# Patient Record
Sex: Male | Born: 1945 | Race: White | Hispanic: No | Marital: Married | Smoking: Never smoker
Health system: Southern US, Community
[De-identification: ages and names within clinical notes are randomized; demographics above are authoritative.]

## PROBLEM LIST (undated history)

## (undated) DIAGNOSIS — I4891 Unspecified atrial fibrillation: Secondary | ICD-10-CM

## (undated) DIAGNOSIS — E785 Hyperlipidemia, unspecified: Secondary | ICD-10-CM

## (undated) HISTORY — DX: Unspecified atrial fibrillation: I48.91

## (undated) HISTORY — DX: Hyperlipidemia, unspecified: E78.5

---

## 2000-12-22 DIAGNOSIS — I4891 Unspecified atrial fibrillation: Secondary | ICD-10-CM

## 2000-12-22 HISTORY — DX: Unspecified atrial fibrillation: I48.91

## 2015-05-23 HISTORY — PX: PROCTECTOMY: SHX315

## 2016-07-09 HISTORY — PX: CORONARY ARTERY BYPASS GRAFT: SHX141

## 2017-04-19 ENCOUNTER — Emergency Department (HOSPITAL_COMMUNITY): Payer: Medicare Other

## 2017-04-19 ENCOUNTER — Emergency Department (HOSPITAL_COMMUNITY)
Admission: EM | Admit: 2017-04-19 | Discharge: 2017-04-19 | Disposition: A | Discharge status: Home / Self Care | Payer: Medicare Other | Attending: Emergency Medicine | Admitting: Emergency Medicine

## 2017-04-19 ENCOUNTER — Encounter (HOSPITAL_COMMUNITY): Payer: Self-pay

## 2017-04-19 DIAGNOSIS — I4891 Unspecified atrial fibrillation: Secondary | ICD-10-CM | POA: Insufficient documentation

## 2017-04-19 DIAGNOSIS — Z951 Presence of aortocoronary bypass graft: Secondary | ICD-10-CM | POA: Insufficient documentation

## 2017-04-19 DIAGNOSIS — R911 Solitary pulmonary nodule: Secondary | ICD-10-CM | POA: Insufficient documentation

## 2017-04-19 HISTORY — DX: Unspecified atrial fibrillation: I48.91

## 2017-04-19 LAB — CBC WITH DIFFERENTIAL/PLATELET
BASOS PCT: 0 %
Basophils Absolute: 0 10*3/uL (ref 0.0–0.1)
EOS ABS: 0.3 10*3/uL (ref 0.0–0.7)
Eosinophils Relative: 5 %
HEMATOCRIT: 39 % (ref 39.0–52.0)
Hemoglobin: 13.1 g/dL (ref 13.0–17.0)
Lymphocytes Relative: 17 %
Lymphs Abs: 1.2 10*3/uL (ref 0.7–4.0)
MCH: 30.6 pg (ref 26.0–34.0)
MCHC: 33.6 g/dL (ref 30.0–36.0)
MCV: 91.1 fL (ref 78.0–100.0)
MONO ABS: 0.9 10*3/uL (ref 0.1–1.0)
MONOS PCT: 12 %
NEUTROS ABS: 4.6 10*3/uL (ref 1.7–7.7)
Neutrophils Relative %: 66 %
Platelets: 192 10*3/uL (ref 150–400)
RBC: 4.28 MIL/uL (ref 4.22–5.81)
RDW: 13.1 % (ref 11.5–15.5)
WBC: 7.1 10*3/uL (ref 4.0–10.5)

## 2017-04-19 LAB — URINALYSIS, ROUTINE W REFLEX MICROSCOPIC
Bilirubin Urine: NEGATIVE
Glucose, UA: NEGATIVE mg/dL
Hgb urine dipstick: NEGATIVE
Ketones, ur: NEGATIVE mg/dL
LEUKOCYTES UA: NEGATIVE
NITRITE: NEGATIVE
PH: 7 (ref 5.0–8.0)
Protein, ur: NEGATIVE mg/dL
SPECIFIC GRAVITY, URINE: 1.006 (ref 1.005–1.030)

## 2017-04-19 LAB — I-STAT TROPONIN, ED: Troponin i, poc: 0.01 ng/mL (ref 0.00–0.08)

## 2017-04-19 LAB — BASIC METABOLIC PANEL
Anion gap: 10 (ref 5–15)
BUN: 19 mg/dL (ref 6–20)
CALCIUM: 9.7 mg/dL (ref 8.9–10.3)
CO2: 25 mmol/L (ref 22–32)
CREATININE: 1.06 mg/dL (ref 0.61–1.24)
Chloride: 105 mmol/L (ref 101–111)
GFR calc Af Amer: >60 mL/min (ref >60)
GFR calc non Af Amer: >60 mL/min (ref >60)
GLUCOSE: 121 mg/dL — AB (ref 65–99)
Potassium: 3.7 mmol/L (ref 3.5–5.1)
Sodium: 140 mmol/L (ref 135–145)

## 2017-04-19 MED ORDER — DILTIAZEM HCL-DEXTROSE 100-5 MG/100ML-% IV SOLN (PREMIX)
5.0000 mg/h | Freq: Once | INTRAVENOUS | Status: AC
  Administered 2017-04-19: 5 mg/h via INTRAVENOUS
  Filled 2017-04-19: qty 100

## 2017-04-19 MED ORDER — APIXABAN 5 MG PO TABS
5.0000 mg | ORAL_TABLET | Freq: Two times a day (BID) | ORAL | Status: DC
  Administered 2017-04-19: 5 mg via ORAL
  Filled 2017-04-19: qty 1

## 2017-04-19 MED ORDER — APIXABAN 5 MG PO TABS: 5.0000 mg | ORAL_TABLET | Freq: Two times a day (BID) | ORAL | 0 refills | Status: AC

## 2017-04-19 MED ORDER — ETOMIDATE 2 MG/ML IV SOLN
5.0000 mg | Freq: Once | INTRAVENOUS | Status: AC
  Administered 2017-04-19: 5 mg via INTRAVENOUS
  Filled 2017-04-19: qty 10

## 2017-04-19 NOTE — Sedation Documentation (Signed)
Family updated as to patient's status and at bedside  

## 2017-04-19 NOTE — ED Provider Notes (Signed)
[facility]-EMERGENCY DEPT Provider Note   CSN: [id number] Arrival date & time: 04/19/17  0105   By signing my name below, I, Clovis Pu, attest that this documentation has been prepared under the direction and in the presence of Zadie Rhine, MD  Electronically Signed: Clovis Pu, ED Scribe. 04/19/17. 1:26 AM.   History   Chief Complaint Chief Complaint  Patient presents with  . Atrial Fibrillation    HPI Comments:  Stanley Robinson is a 71 y.o. male, with a PMHX of a-fib and PSHx of CABG in 2017, who presents to the Emergency Department complaining of acute onset, persistent palpitations beginning around 11 PM yesterday. Pt notes he was watching TV before going to sleep when his symptoms began and states he felt fine before this episode began. He notes a hx of atrial fibrillation in 2002. Pt also reports frequent urination. Pt notes he takes statin and baby aspirin on a daily basis. No alleviating or aggravating factors noted. Pt denies chest pain, fevers, vomiting, SOB, abdominal pain, leg swelling or any other associated symptoms.  Pt also denies blood thinner use, food allergies, a hx of COPD, lung problems or a hx of valve replacements. His last meal was at 7 PM yesterday. Pt is allergic to sulfa drugs. No other complaints noted at this time.   The history is provided by the patient. No language interpreter was used.  Atrial Fibrillation  This is a new problem. The current episode started 1 to 2 hours ago. The problem occurs constantly. The problem has not changed since onset.Pertinent negatives include no chest pain, no abdominal pain and no shortness of breath. Nothing aggravates the symptoms. Nothing relieves the symptoms. He has tried nothing for the symptoms.    Past Medical History:  Diagnosis Date  . Atrial fibrillation (HCC) 2002    There are no active problems to display for this patient.   Past Surgical History:  Procedure Laterality Date  . CORONARY ARTERY BYPASS  GRAFT  07/09/2016  . PROCTECTOMY  05/2015    Home Medications    Prior to Admission medications   Not on File    Family History History reviewed. No pertinent family history.  Social History Social History  Substance Use Topics  . Smoking status: Never Smoker  . Smokeless tobacco: Never Used  . Alcohol use 1.2 oz/week    2 Cans of beer per week     Allergies   Sulfa antibiotics   Review of Systems Review of Systems  Constitutional: Negative for fever.  Respiratory: Negative for shortness of breath.   Cardiovascular: Positive for palpitations. Negative for chest pain and leg swelling.  Gastrointestinal: Negative for abdominal pain and vomiting.  Genitourinary: Positive for frequency.  All other systems reviewed and are negative.   Physical Exam Updated Vital Signs BP (!) 114/93 (BP Location: Right Arm)   Pulse (!) 151   Temp 97.9 F (36.6 C) (Oral)   Resp 20   Ht  (1.676 m)   Wt 165 lb (74.8 kg)   SpO2 96%   BMI 26.63 kg/m   Physical Exam CONSTITUTIONAL: Well developed/well nourished HEAD: Normocephalic/atraumatic EYES: EOMI  ENMT: Mucous membranes moist NECK: supple no meningeal signs SPINE/BACK:entire spine nontender CV: tachycardic and irregular  LUNGS: Lungs are clear to auscultation bilaterally, no apparent distress ABDOMEN: soft, nontender, no rebound or guarding, bowel sounds noted throughout abdomen GU:no cva tenderness NEURO: Pt is awake/alert/appropriate, moves all extremitiesx4.  No facial droop.   EXTREMITIES: pulses normal/equal, full ROM  SKIN: warm, color normal. Well healed sternotomy scar noted.  PSYCH: no abnormalities of mood noted, alert and oriented to situation  ED Treatments / Results  DIAGNOSTIC STUDIES:  Oxygen Saturation is 96% on RA, normal by my interpretation.    COORDINATION OF CARE:  1:20 AM Discussed treatment plan with pt at bedside and pt agreed to plan.  Labs (all labs ordered are listed, but only  abnormal results are displayed) Labs Reviewed  BASIC METABOLIC PANEL - Abnormal; Notable for the following:       Result Value   Glucose, Bld 121 (*)    All other components within normal limits  URINALYSIS, ROUTINE W REFLEX MICROSCOPIC - Abnormal; Notable for the following:    Color, Urine STRAW (*)    All other components within normal limits  CBC WITH DIFFERENTIAL/PLATELET  Rosezena Sensor, ED    EKG  EKG Interpretation  Date/Time:  Sunday April 19 2017 01:06:54 EDT Ventricular Rate:  152 PR Interval:    QRS Duration: 79 QT Interval:  250 QTC Calculation: 398 R Axis:   94 Text Interpretation:  Atrial fibrillation with rapid V-rate Right axis deviation Low voltage, precordial leads Repolarization abnormality, prob rate related Abnormal ekg No previous ECGs available Confirmed by Bebe Shaggy  MD, Devontae Casasola ([id number]) on 04/19/2017 1:12:33 AM       Radiology Dg Chest 2 View  Result Date: 04/19/2017 CLINICAL DATA:  71 year old male with palpitation. EXAM: CHEST  2 VIEW COMPARISON:  None. FINDINGS: There is mild eventration of the right hemidiaphragm. Minimal bibasilar atelectatic changes, left greater right. There is no focal consolidation, pleural effusion, or pneumothorax. A faint 11 mm nodular appearing density in the left upper lobe superimposed over the clavicle is likely artifactual and summation of the osseous structures. The lung nodule is less likely. CT may provide better evaluation of the lungs. The cardiac silhouette is within normal limits. Median sternotomy wires and CABG vascular clips noted. Osteopenia with degenerative changes of the spine. No acute fracture. IMPRESSION: 1. No acute cardiopulmonary process. 2. Artifact versus less likely left upper lobe nodule. Electronically Signed   By: Elgie Collard M.D.   On: 04/19/2017 02:14    Procedures .Cardioversion Date/Time: 04/19/2017 5:28 AM Performed by: Zadie Rhine Authorized by: Zadie Rhine   Consent:     Consent obtained:  Written   Consent given by:  Patient   Risks discussed:  Induced arrhythmia and pain   Alternatives discussed:  Rate-control medication and anti-coagulation medication Pre-procedure details:    Cardioversion basis:  Emergent   Rhythm:  Atrial fibrillation   Electrode placement:  Anterior-posterior Attempt one:    Cardioversion mode:  Synchronous   Shock (Joules):  100   Shock outcome:  Conversion to normal sinus rhythm Attempt three:    Shock (Joules):  100 Post-procedure details:    Patient status:  Alert   Patient tolerance of procedure:  Tolerated well, no immediate complications    Procedural sedation Performed by: Joya Gaskins Consent: Verbal consent obtained. Risks and benefits: risks, benefits and alternatives were discussed Required items: required  devices, and special equipment available Patient identity confirmed: arm band and provided demographic data Time out: Immediately prior to procedure a "time out" was called to verify the correct patient, procedure, equipment, support staff and site/side marked as required.  Sedation type: moderate (conscious) sedation NPO time confirmed and considedered  Sedatives: ETOMIDATE  Physician Time at Bedside: 16  Vitals: Vital signs were monitored during sedation. Cardiac Monitor, pulse oximeter Patient tolerance:  Patient tolerated the procedure well with no immediate complications. Comments: Pt with uneventful recovered. Returned to pre-procedural sedation baseline   Medications Ordered in ED Medications  apixaban (ELIQUIS) tablet 5 mg (5 mg Oral Given 04/19/17 0600)  diltiazem (CARDIZEM) 100 mg in dextrose 5% (1 mg/mL) infusion (0 mg/hr Intravenous Stopped 04/19/17 0500)  etomidate (AMIDATE) injection 5 mg (5 mg Intravenous Given 04/19/17 0510)     Initial Impression / Assessment and Plan / ED Course  I have reviewed the triage vital signs and the nursing notes.  Pertinent labs & imaging  results that were available during my care of the patient were reviewed by me and considered in my medical decision making (see chart for details).   This patients CHA2DS2-VASc Score and unadjusted Ischemic Stroke Rate (% per year) is equal to 2.2 % stroke rate/year from a score of 2  Above score calculated as 1 point each if present [CHF, HTN, DM, Vascular=MI/PAD/Aortic Plaque, Age if 65-74, or Male] Above score calculated as 2 points each if present [Age > 75, or Stroke/TIA/TE]      3:53 AM Pt still in atrial fibrillation, less than 48 hours Will proceed with cardioversion We discussed at length risk/benefits and pt agrees to cardioversion 5:28 AM Pt awake/alert He is back in sinus rhythm Pharmacy consult for anticoagulation  EKG Interpretation  Date/Time:  Sunday April 19 2017 05:12:16 EDT Ventricular Rate:  97 PR Interval:    QRS Duration: 99 QT Interval:  346 QTC Calculation: 440 R Axis:   34 Text Interpretation:  Sinus rhythm Low voltage, precordial leads Abnormal R-wave progression, early transition ekg change from prior Confirmed by Bebe Shaggy  MD, Krisy Dix ([id number]) on 04/19/2017 5:26:50 AM      6:14 AM Pt at baseline He is in sinus rhythm He is recovered from etomidate Seen by pharm - started on eliquis He will call his cardiologist in [place] when he returns later today  He was also informed for ?nodule on xray and need for repeat xray in a month  Pt/wife agreeable with plan  Final Clinical Impressions(s) / ED Diagnoses   Final diagnoses:  Atrial fibrillation with RVR (HCC)  Lung nodule    New Prescriptions New Prescriptions   APIXABAN (ELIQUIS) 5 MG TABS TABLET    Take 1 tablet (5 mg total) by mouth 2 (two) times daily.  I personally performed the services described in this documentation, which was scribed in my presence. The recorded information has been reviewed and is accurate.        Zadie Rhine, MD 04/19/17 [id number]

## 2017-04-19 NOTE — ED Triage Notes (Signed)
Per ems pt from out of town trying to go to sleep when he felt palpitation and frequent urination, and ems states he had afib with rvr  pt has a history of a fib.

## 2017-04-19 NOTE — Sedation Documentation (Signed)
Patient is resting comfortably. Denies pain. Vitals stable.

## 2017-04-20 ENCOUNTER — Telehealth (HOSPITAL_COMMUNITY): Payer: Self-pay | Admitting: *Deleted

## 2017-04-20 NOTE — Telephone Encounter (Signed)
Attempted to contact pt x 3 rang busy.  Will try again.  Pt is referral from ED for afib

## 2017-04-23 ENCOUNTER — Telehealth (HOSPITAL_COMMUNITY): Payer: Self-pay | Admitting: *Deleted

## 2017-04-23 NOTE — Telephone Encounter (Signed)
[place] [facility] [place]

## 2017-08-16 IMAGING — DX DG CHEST 2V
2 series · 2 of 2 positions shown · non-contrast
Comparison: None.

CLINICAL DATA: 70-year-old male with palpitation.

EXAM:
CHEST  2 VIEW

[chest pa]
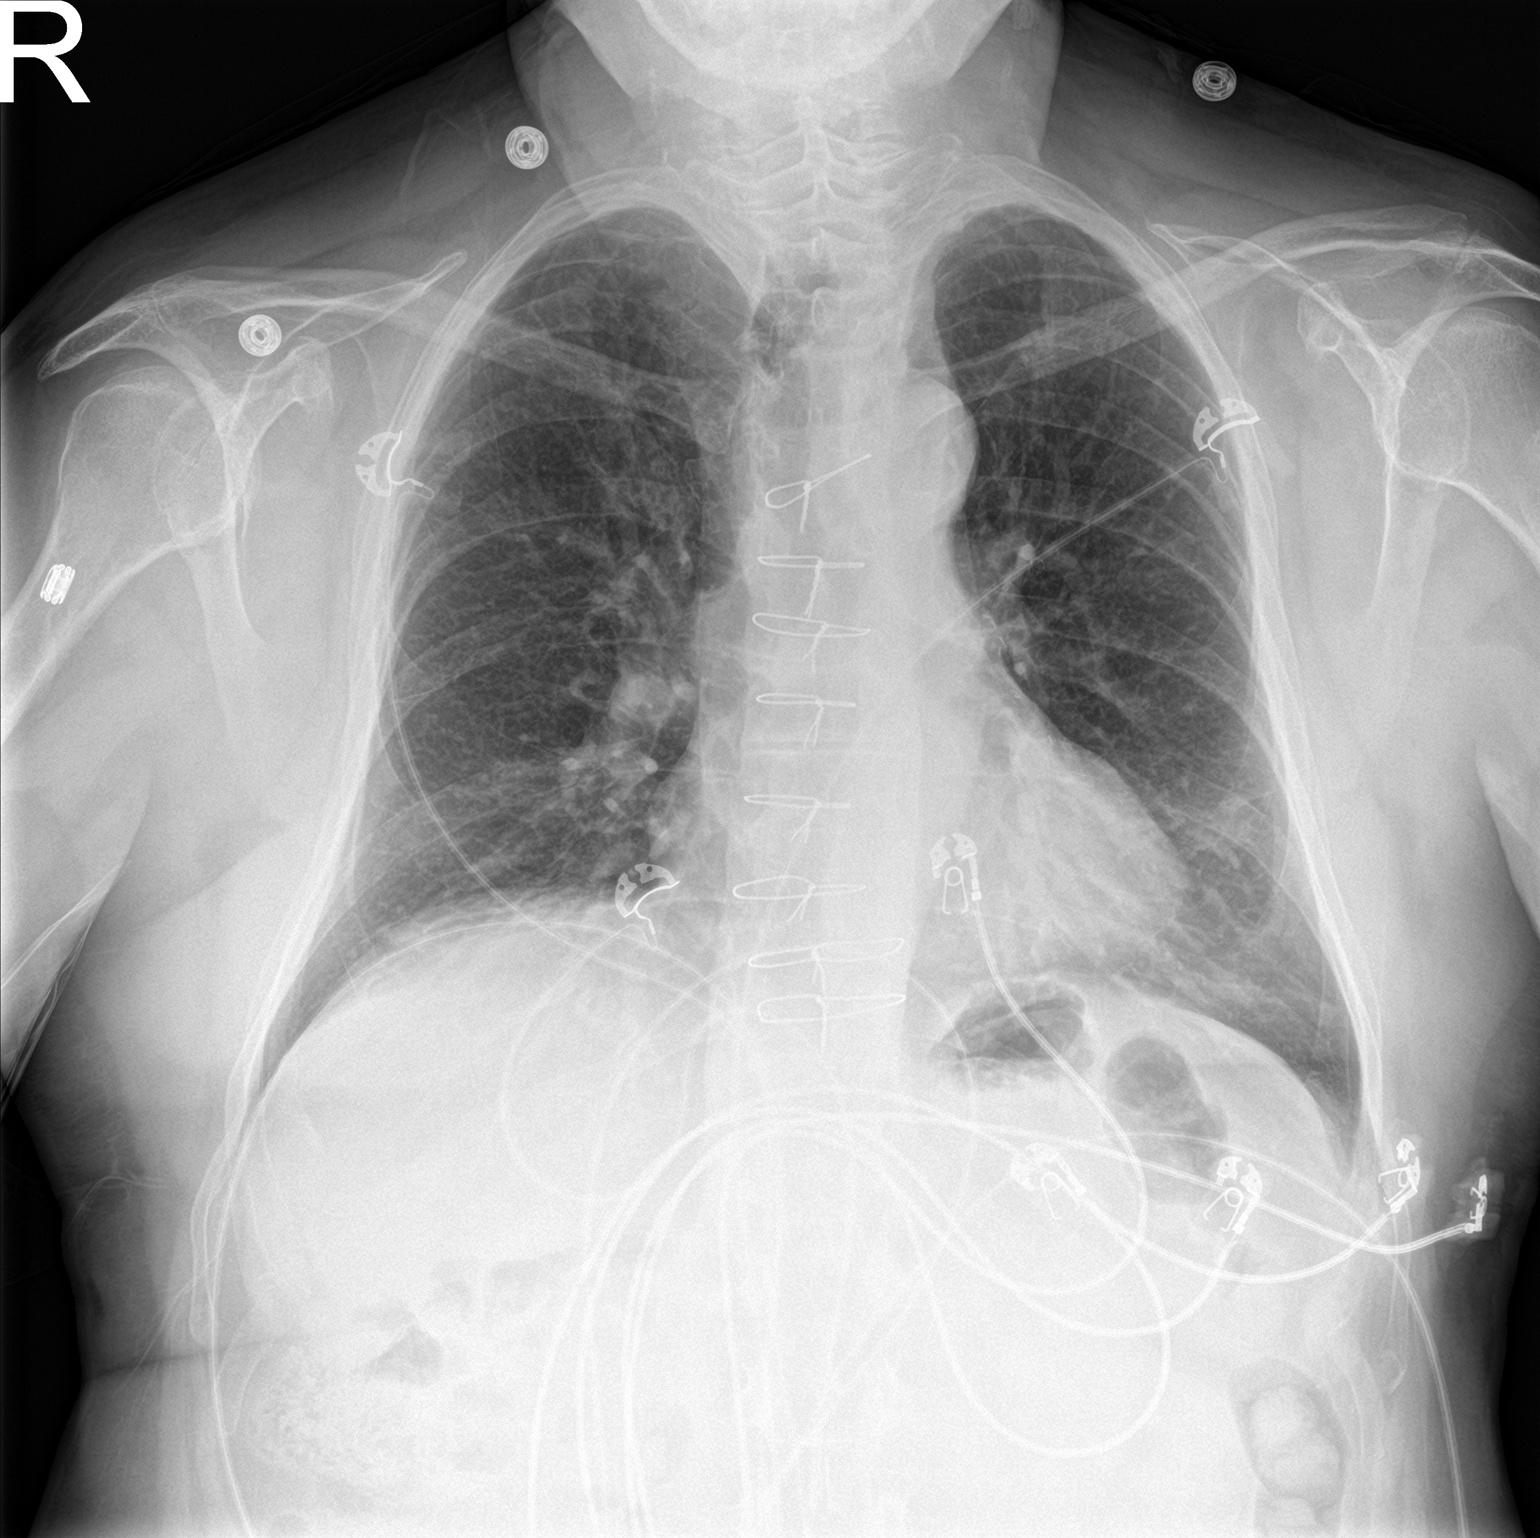

[chest lat]
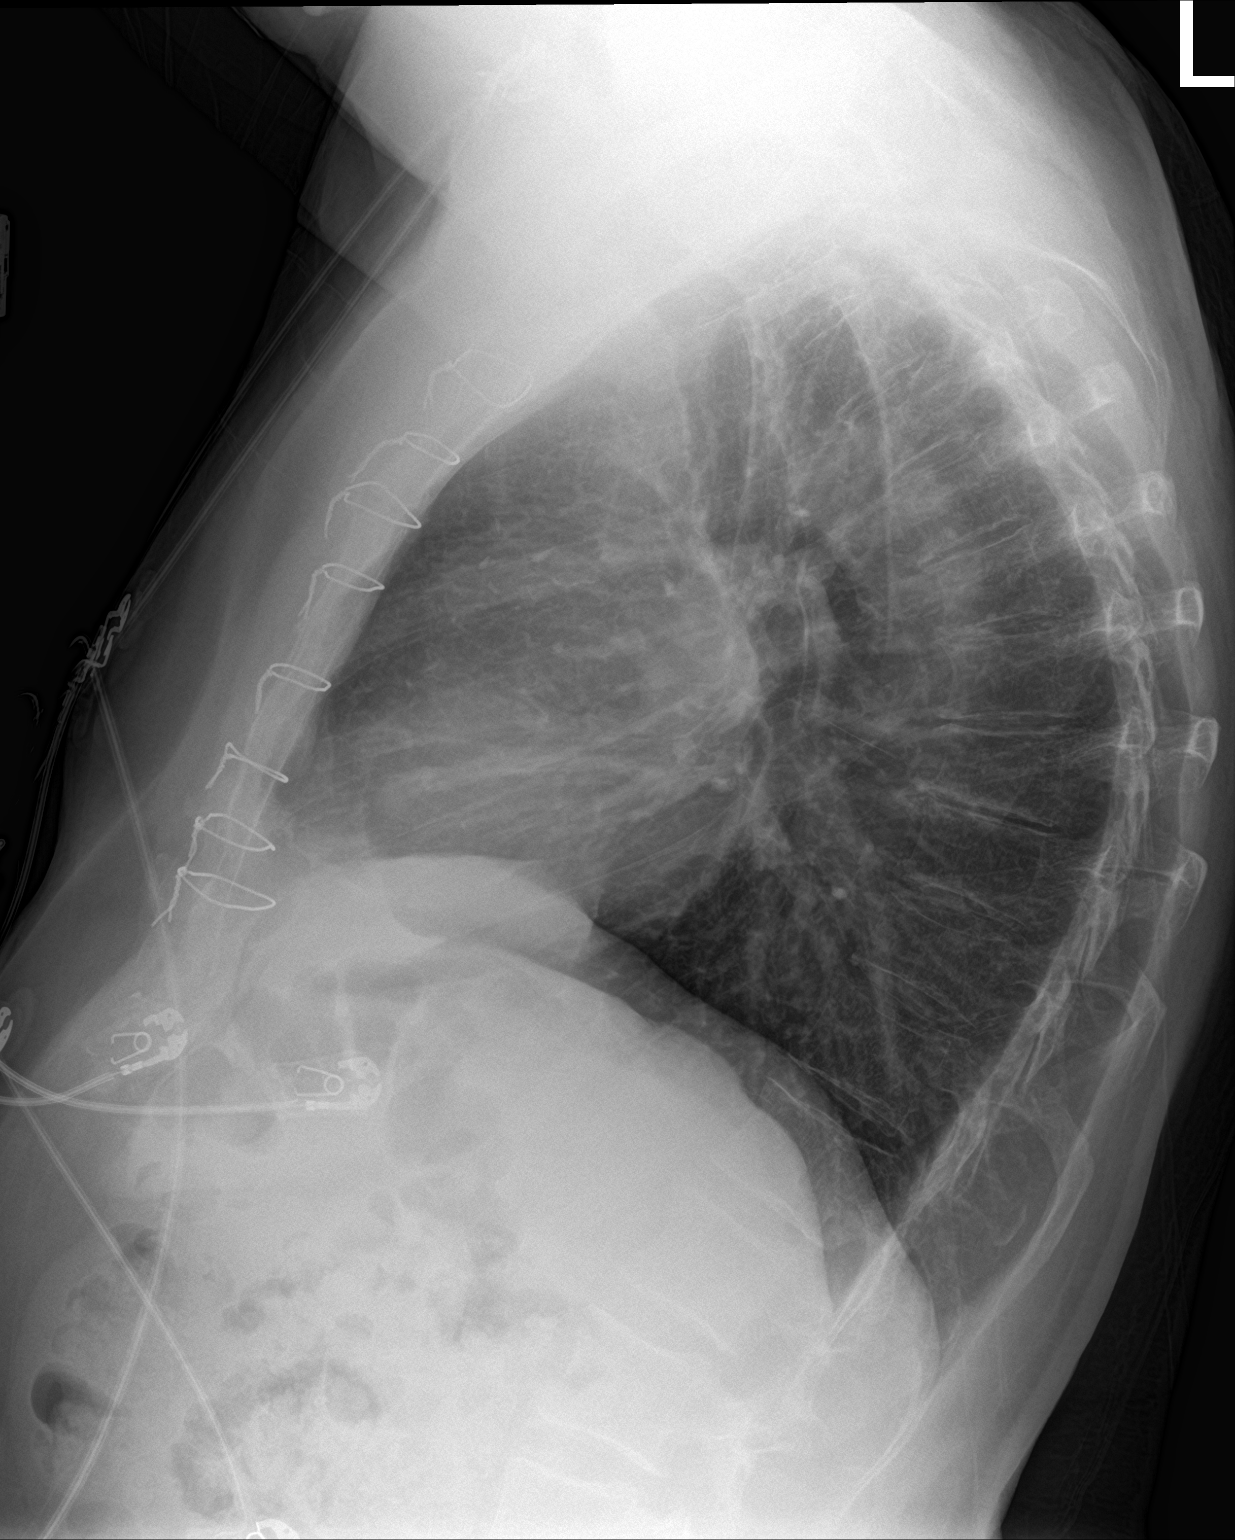

[2 of 2 positions shown; findings below may reference images not displayed]

FINDINGS: There is mild eventration of the right hemidiaphragm. Minimal
bibasilar atelectatic changes, left greater right. There is no focal
consolidation, pleural effusion, or pneumothorax. A faint 11 mm
nodular appearing density in the left upper lobe superimposed over
the clavicle is likely artifactual and summation of the osseous
structures. The lung nodule is less likely. CT may provide better
evaluation of the lungs. The cardiac silhouette is within normal
limits. Median sternotomy wires and CABG vascular clips noted.
Osteopenia with degenerative changes of the spine. No acute
fracture.
IMPRESSION: 1. No acute cardiopulmonary process.
2. Artifact versus less likely left upper lobe nodule.

## 2018-06-26 ENCOUNTER — Emergency Department
Admission: EM | Admit: 2018-06-26 | Discharge: 2018-06-26 | Disposition: A | Discharge status: Home / Self Care | Payer: Medicare Other | Attending: Emergency Medicine | Admitting: Emergency Medicine

## 2018-06-26 ENCOUNTER — Emergency Department: Payer: Medicare Other

## 2018-06-26 DIAGNOSIS — Z87442 Personal history of urinary calculi: Secondary | ICD-10-CM | POA: Insufficient documentation

## 2018-06-26 DIAGNOSIS — N202 Calculus of kidney with calculus of ureter: Secondary | ICD-10-CM | POA: Insufficient documentation

## 2018-06-26 DIAGNOSIS — E785 Hyperlipidemia, unspecified: Secondary | ICD-10-CM | POA: Insufficient documentation

## 2018-06-26 DIAGNOSIS — N200 Calculus of kidney: Secondary | ICD-10-CM

## 2018-06-26 DIAGNOSIS — Z79899 Other long term (current) drug therapy: Secondary | ICD-10-CM | POA: Insufficient documentation

## 2018-06-26 LAB — GFR: EGFR: >60

## 2018-06-26 LAB — URINALYSIS, REFLEX TO MICROSCOPIC EXAM IF INDICATED
Bilirubin, UA: NEGATIVE
Glucose, UA: NEGATIVE
Ketones UA: NEGATIVE
Leukocyte Esterase, UA: NEGATIVE
Nitrite, UA: NEGATIVE
Protein, UR: 100 — AB
Specific Gravity UA: 1.02 (ref 1.001–1.035)
Urine pH: 6 (ref 5.0–8.0)
Urobilinogen, UA: NORMAL mg/dL

## 2018-06-26 LAB — CBC AND DIFFERENTIAL
Absolute NRBC: 0 10*3/uL (ref 0.00–0.00)
Basophils Absolute Automated: 0.03 10*3/uL (ref 0.00–0.08)
Basophils Automated: 0.4 %
Eosinophils Absolute Automated: 0.26 10*3/uL (ref 0.00–0.44)
Eosinophils Automated: 3.8 %
Hematocrit: 39.3 % (ref 37.6–49.6)
Hgb: 13.3 g/dL (ref 12.5–17.1)
Immature Granulocytes Absolute: 0.03 10*3/uL (ref 0.00–0.07)
Immature Granulocytes: 0.4 %
Lymphocytes Absolute Automated: 0.99 10*3/uL (ref 0.42–3.22)
Lymphocytes Automated: 14.4 %
MCH: 31.4 pg (ref 25.1–33.5)
MCHC: 33.8 g/dL (ref 31.5–35.8)
MCV: 92.9 fL (ref 78.0–96.0)
MPV: 9.6 fL (ref 8.9–12.5)
Monocytes Absolute Automated: 1.09 10*3/uL — ABNORMAL HIGH (ref 0.21–0.85)
Monocytes: 15.9 %
Neutrophils Absolute: 4.47 10*3/uL (ref 1.10–6.33)
Neutrophils: 65.1 %
Nucleated RBC: 0 /100 WBC (ref 0.0–0.0)
Platelets: 184 10*3/uL (ref 142–346)
RBC: 4.23 10*6/uL (ref 4.20–5.90)
RDW: 12 % (ref 11–15)
WBC: 6.87 10*3/uL (ref 3.10–9.50)

## 2018-06-26 LAB — PT AND APTT
PT INR: 1.1 (ref 0.9–1.1)
PT: 14.2 s (ref 12.6–15.0)
PTT: 32 s (ref 23–37)

## 2018-06-26 LAB — BASIC METABOLIC PANEL
BUN: 21 mg/dL (ref 9.0–28.0)
CO2: 28 mEq/L (ref 22–29)
Calcium: 9.8 mg/dL (ref 7.9–10.2)
Chloride: 103 mEq/L (ref 100–111)
Creatinine: 1.1 mg/dL (ref 0.7–1.3)
Glucose: 126 mg/dL — ABNORMAL HIGH (ref 70–100)
Potassium: 3.7 mEq/L (ref 3.5–5.1)
Sodium: 138 mEq/L (ref 136–145)

## 2018-06-26 MED ORDER — TAMSULOSIN HCL 0.4 MG PO CAPS
0.40 mg | ORAL_CAPSULE | Freq: Every day | ORAL | 0 refills | Status: AC
Start: 2018-06-26 — End: 2018-07-06

## 2018-06-26 MED ORDER — SODIUM CHLORIDE 0.9 % IV BOLUS
1000.00 mL | Freq: Once | INTRAVENOUS | Status: AC
Start: 2018-06-26 — End: 2018-06-26
  Administered 2018-06-26: 22:00:00 1000 mL via INTRAVENOUS

## 2018-06-26 NOTE — Discharge Instructions (Signed)
Dear Alexander Harrell:    Thank you for choosing the [facility] Emergency Department, the premier emergency department in the [place] area.  I hope your visit today was EXCELLENT.    Specific instructions for your visit today:    Follow up with your urologist in the next few days.  Return for worsening pain, worsening blood in urine or other concerns.      Kidney Stones    You have been seen for a kidney stone.    A kidney stone is a hard mineral and crystalline material (a lot like gravel). It forms inside the kidney or the urinary tract. When it moves from the kidney into the tube between the kidney and the bladder (the ureter), it causes severe pain. Kidney stones form when there is less urine (pee) or too many stone-forming substances in the urine. Normally, kidney stones are made of calcium oxalate or calcium phosphate. Kidney stones associated with infection in the urinary tract are called struvite or infection stones.    Symptoms include sharp pain in the side that may radiate (spread) to the groin. Nausea and vomiting are also common. A doctor diagnosed your kidney stone based on your exam, urine test, and medical history. The doctor may have run a special test called a helical CT stone study or an intravenous pyelogram (IVP).    Men get kidney stones more often than women. Whites get them more often than African-Americans. Kidney stones become more common when men reach their 7s. The risk gets higher with age. People who have already had more than one kidney stone often get more stones.    There are different conditions that can cause kidney stones: Hypercalcuria is an inherited (genetic) condition that causes high calcium in the urine. This causes stones in more than half of cases. There are other conditions that cause an increased risk of kidney stones. These include gout (a joint condition), hyperparathyroidism (a hormone condition), inflammatory bowel disease (Crohn's disease and Ulcerative  colitis) and intestinal bypass surgery. They also include kidney diseases like renal tubular acidosis. Certain medicines also increase the risk of kidney stones. These include some diuretics (water pills), antacids with calcium, and the HIV drug indinavir (Crixivan).    Most kidney stones pass on their own. Larger stones or stones that don't pass in a few days may need to be taken out. This is done by a urologist (a doctor who specializes in the urinary tract). Kidney stones are normally treated with pain and nausea medicine.    You should drink lots of fluid--up to 8 glasses of water a day. You should follow up with a urologist in the next 2-3 weeks. Strain all of your urine so you can catch the kidney stone as it passes out of the bladder. Keep the stone and bring it with you to your urology appointment. The urologist may have the stone tested to find out what it is made of. This may help the urologist recommend diet changes. He or she may also suggest medicines to help prevent more kidney stones.    YOU SHOULD SEEK MEDICAL ATTENTION IMMEDIATELY, EITHER HERE OR AT THE NEAREST EMERGENCY DEPARTMENT, IF ANY OF THE FOLLOWING OCCURS:   The pain gets worse or the medicine isn't enough to treat your pain.   You get sick (nausea) or vomit and can't keep down fluids or pain medicine.   You have a fever (temperature higher than 100.64F / 38C) or shaking chills.  If you do not continue to improve or your condition worsens, please contact your doctor or return immediately to the Emergency Department.    Sincerely,  Effie Shy Wendee Beavers, MD  Attending Emergency Physician  [facility] Emergency Department    ONSITE PHARMACY  Our full service onsite pharmacy is located in the ER waiting room.  Open 7 days a week from 9 am to 9 pm.  We accept all major insurances and prices are competitive with major retailers.  Ask your provider to print your prescriptions down to the pharmacy to speed you on your  way home.    OBTAINING A PRIMARY CARE APPOINTMENT    Primary care physicians (PCPs, also known as primary care doctors) are either internists or family medicine doctors. Both types of PCPs focus on health promotion, disease prevention, patient education and counseling, and treatment of acute and chronic medical conditions.    Call for an appointment with a primary care doctor.  Ask to see who is taking new patients.     [facility]  telephone:  [phone]  [email or web address]    DOCTOR REFERRALS  Call [phone] (available 24 hours a day, 7 days a week) if you need any further referrals and we can help you find a primary care doctor or specialist.  Also, available online at:  [email or web address]    YOUR CONTACT INFORMATION  Before leaving please check with registration to make sure we have an up-to-date contact number.  You can call registration at [phone] to update your information.  For questions about your hospital bill, please call [phone].  For questions about your Emergency Dept Physician bill please call [phone].      FREE HEALTH SERVICES  If you need help with health or social services, please call 2-1-1 for a free referral to resources in your area.  2-1-1 is a free service connecting people with information on health insurance, free clinics, pregnancy, mental health, dental care, food assistance, housing, and substance abuse counseling.  Also, available online at:  http://www.[facility].org    MEDICAL RECORDS AND TESTS  Certain laboratory test results do not come back the same day, for example urine cultures.   We will contact you if other important findings are noted.  Radiology films are often reviewed again to ensure accuracy.  If there is any discrepancy, we will notify you.      Please call [phone] to pick up a complimentary CD of any radiology studies performed.  If you or your doctor would like to request a copy of your medical records, please  call [phone].      ORTHOPEDIC INJURY   Please know that significant injuries can exist even when an initial x-ray is read as normal or negative.  This can occur because some fractures (broken bones) are not initially visible on x-rays.  For this reason, close outpatient follow-up with your primary care doctor or bone specialist (orthopedist) is required.    MEDICATIONS AND FOLLOWUP  Please be aware that some prescription medications can cause drowsiness.  Use caution when driving or operating machinery.    The examination and treatment you have received in our Emergency Department is provided on an emergency basis, and is not intended to be a substitute for your primary care physician.  It is important that your doctor checks you again and that you report any new or remaining problems at that time.      24 HOUR PHARMACIES  The nearest 24 hour pharmacy is:  [facility] [place], [place] [address]  [place]  [facility])  Call to start or finish an application, compare plans, enroll or ask a question.  [place]: [phone]  Web:  Healthcare.gov    Help Enrolling in [place]  406-598-1554 (TOLL-FREE)  6284775361 (TTY)  Web:  Http://www.coverva.org    Local Help Enrolling in the [place]  662-357-5582 (MAIN)  Email:  health-help'@nvfs'$ .org  Web:  http://lewis-perez.info/  Address:  [id number] [place],  [address]    SEDATING MEDICATIONS  Sedating medications include strong pain medications (e.g. narcotics), muscle relaxers, benzodiazepines (used for anxiety and as muscle relaxers), Benadryl/diphenhydramine and other antihistamines for allergic reactions/itching, and other medications.  If you are unsure if you have received a sedating medication, please ask your physician or nurse.  If you received a sedating medication: DO NOT drive a car. DO NOT  operate machinery. DO NOT perform jobs where you need to be alert.  DO NOT drink alcoholic beverages while taking this medicine.     If you get dizzy, sit or lie down at the first signs. Be careful going up and down stairs.  Be extra careful to prevent falls.     Never give this medicine to others.     Keep this medicine out of reach of children.     Do not take or save old medicines. Throw them away when outdated.     Keep all medicines in a cool, dry place. DO NOT keep them in your bathroom medicine cabinet or in a cabinet above the stove.    MEDICATION REFILLS  Please be aware that we cannot refill any prescriptions through the ER. If you need further treatment from what is provided at your ER visit, please follow up with your primary care doctor or your pain management specialist.    [facility]  Did you know [facility] has two freestanding ERs located just a few miles away?  [facility] [place] have short wait times, easy free parking directly in front of the building and top patient satisfaction scores - and the same Board Certified Emergency Medicine doctors as [facility].

## 2018-06-26 NOTE — ED Provider Notes (Signed)
EMERGENCY DEPARTMENT HISTORY AND PHYSICAL EXAM    Date: 06/26/18  Patient Name: Alexander Harrell  Attending Physician: Trudie Buckler, MD      Disposition and Treatment Plan    Clinical Impression:   1. Kidney stone      Disposition:   ED Disposition     ED Disposition Condition Date/Time Comment    Discharge  Sat Jun 26, 2018 11:12 PM Maleke Feria Perkins discharge to home/self care.    Condition at disposition: Stable               History of Presenting Illness     Chief Complaint:   Chief Complaint   Patient presents with   . Hematuria     Alexander Harrell is a 72 y.o. male who has PMHx of kidney stones who c/o intermittent, worsening hematuria.  Pt states he's had scant blood in urine intermittently for the past month. Today, noticed he was urinating BRB. A/w some dysuria at the end of his stream. Also reports mild right flank pain. Called urologist who referred pt to ER. Pt has cystoscopy in 6 days.  Denies n/v or f/c. Does not feel like he is retaining any urine.  Pt from [place], planned to return in 2 days.  This history was obtained from patient and review of prior chart.     PCP: Pcp, None, MD      Past Medical History     Past Medical History:   Diagnosis Date   . A-fib    . Hyperlipidemia        Past Surgical History:   Procedure Laterality Date   . CORONARY ARTERY BYPASS     . PROSTATECTOMY         Family History     History reviewed. No pertinent family history.    Social History     Social History     Social History   . Marital status: Married     Spouse name: N/A   . Number of children: N/A   . Years of education: N/A     Occupational History   . Not on file.     Social History Main Topics   . Smoking status: Never Smoker   . Smokeless tobacco: Never Used   . Alcohol use No   . Drug use: No   . Sexual activity: Not on file     Other Topics Concern   . Not on file     Social History Narrative   . No narrative on file        Allergies     Allergies   Allergen Reactions   . Multihance  [Gadobenate] Itching and Rash   . Sulfa Antibiotics Itching and Rash       Medications     No current facility-administered medications for this encounter.     Current Outpatient Prescriptions:   .  atorvastatin (LIPITOR) 40 MG tablet, Take 40 mg by mouth daily, Disp: , Rfl:   .  dilTIAZem (CARDIZEM) 120 MG tablet, Take 120 mg by mouth daily, Disp: , Rfl:   .  tamsulosin (FLOMAX) 0.4 MG Cap, Take 1 capsule (0.4 mg total) by mouth daily for 10 days Discontinue for lightheadedness, Disp: 10 capsule, Rfl: 0    Review of Systems     Pertinent Positives and Negatives noted in the HPI.  All Other Systems Reviewed and Negative: Yes    Physical Exam   Physical Exam   Nursing  note and vitals reviewed.  Constitutional: A&Ox3, NAD  Head: NCAT   ENT: Oropharynx is clear, MMM  Eyes: Conjunctivae normal  Neck: Normal range of motion, neck supple, no JVD present  Cardiovascular: RRR, no M/G/R  Pulmonary/Chest: CTAB, no respiratory distress  Abdominal: Soft, NT, ND  Musculoskeletal: No edema  Neurological: A&Ox3  Skin: Skin is warm and dry  Psychiatric: Pt has a normal mood and affect, behavior is normal     Diagnostic Study Results     Labs -  Labs Reviewed   CBC AND DIFFERENTIAL - Abnormal; Notable for the following:        Result Value    Abs Mono Automated 1.09 (*)     All other components within normal limits   BASIC METABOLIC PANEL - Abnormal; Notable for the following:     Glucose 126 (*)     All other components within normal limits   URINALYSIS, REFLEX TO MICROSCOPIC EXAM IF INDICATED - Abnormal; Notable for the following:     Color, UA Red (*)     Clarity, UA Cloudy (*)     Protein, UR 100 (*)     Blood, UA Large (*)     RBC, UA TNTC (*)     Yeast, UA Few (*)     All other components within normal limits   GFR   PT AND APTT       Radiologic Studies -   CT Abd/Pelvis without Contrast   Final Result       1. 4 mm left mid ureteral calculus with minimal prominence of the left   ureter.   2. Additional bilateral  nonobstructive renal calculi.   3. Colonic diverticulosis.   4. Duodenal diverticulum.   5. L5 spondylolysis with spondylolisthesis.      Kennyth Lose, MD    06/26/2018 10:53 PM            Clinical Course in the Emergency Department   11:12 PM  Explained results to pt.  Feeling better.  No pain.  Urinated again in ED - clear, no gross hematuria.  Will f/u with his urologist in [facility].  Given return precautions.      Medical Decision Making   I am the first provider for this patient.  I reviewed the vital signs, nursing notes, past medical history, past surgical history, family history and social history.  I have reviewed the patient's previous charts.    72 y/o male with gross hematuria, mild R flank pain.  DDx: kidney stone, mass, UTI.  - Labs  - CT  - IVF  - Reassess    Vital Signs - BP 153/71   Pulse 72   Temp 97.9 F (36.6 C) (Oral)   Resp 18   Ht 5\' 6"  (1.676 m)   Wt 74.8 kg   SpO2 99%   BMI 26.63 kg/m    Pulse Oximetry Analysis - Normal  Laboratory results reviewed and interpreted by EDP: Yes  Radiologic study results reviewed by EDP: Yes  Radiologic Studies Interpreted (viewed) by EDP: Yes      Cardiac Monitor interpretation: NSR in 70's.    Critical Care Time: No Critical Care Time    Diagnosis and Treatment Plan       _______________________________    Attestations:  I was acting as a scribe for Trudie Buckler, MD on [employer]  Treatment Team: Scribe: Al Nouman, Mohanned     I am the first provider for this patient  and I personally performed the services documented. Treatment Team: Scribe: Al Nouman, Mohanned is scribing for me on [employer]. This note accurately reflects work and decisions made by me.  Trudie Buckler, MD      _______________________________         Newell Coral, MD  06/26/18 [phone]

## 2018-06-26 NOTE — ED Notes (Signed)
Initial brief evaluation done in triage to expedite care but I am not the primary provider. Pt presents to triage with hematuria, worse than chronic. Sent by uro, cystoscopy next week.        Juliane Poot, MD  06/26/18 [phone]
